# Patient Record
Sex: Female | Born: 1995
Health system: Southern US, Community
[De-identification: ages and names within clinical notes are randomized; demographics above are authoritative.]

---
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
## 2016-01-19 DIAGNOSIS — Z681 Body mass index (BMI) 19 or less, adult: Secondary | ICD-10-CM | POA: Diagnosis not present

## 2016-01-19 DIAGNOSIS — Z113 Encounter for screening for infections with a predominantly sexual mode of transmission: Secondary | ICD-10-CM | POA: Diagnosis not present

## 2016-01-19 DIAGNOSIS — Z01419 Encounter for gynecological examination (general) (routine) without abnormal findings: Secondary | ICD-10-CM | POA: Diagnosis not present

## 2016-01-19 DIAGNOSIS — Z118 Encounter for screening for other infectious and parasitic diseases: Secondary | ICD-10-CM | POA: Diagnosis not present

## 2016-01-19 MED FILL — MEDROXYPROG 150 MG/ML SYR: 150 | 84 days supply | Qty: 1 | Fill #0

## 2016-01-22 DIAGNOSIS — Z3042 Encounter for surveillance of injectable contraceptive: Secondary | ICD-10-CM | POA: Diagnosis not present

## 2016-09-03 ENCOUNTER — Ambulatory Visit: Payer: Self-pay

## 2016-09-04 ENCOUNTER — Ambulatory Visit (INDEPENDENT_AMBULATORY_CARE_PROVIDER_SITE_OTHER): Payer: 59 | Admitting: Physician Assistant

## 2016-09-04 ENCOUNTER — Telehealth: Payer: Self-pay

## 2016-09-04 ENCOUNTER — Ambulatory Visit (INDEPENDENT_AMBULATORY_CARE_PROVIDER_SITE_OTHER): Payer: 59

## 2016-09-04 VITALS — BP 118/76 | HR 96 | Temp 98.3°F | Resp 16 | Ht 67.0 in | Wt 129.0 lb

## 2016-09-04 DIAGNOSIS — R05 Cough: Secondary | ICD-10-CM

## 2016-09-04 DIAGNOSIS — R0981 Nasal congestion: Secondary | ICD-10-CM | POA: Diagnosis not present

## 2016-09-04 DIAGNOSIS — R059 Cough, unspecified: Secondary | ICD-10-CM

## 2016-09-04 DIAGNOSIS — J209 Acute bronchitis, unspecified: Secondary | ICD-10-CM | POA: Diagnosis not present

## 2016-09-04 LAB — POCT CBC
Granulocyte percent: 58.4 % (ref 37–80)
HCT, POC: 38.9 % (ref 37.7–47.9)
Hemoglobin: 13.8 g/dL (ref 12.2–16.2)
Lymph, poc: 2.3 (ref 0.6–3.4)
MCH, POC: 31.2 pg (ref 27–31.2)
MCHC: 35.5 g/dL — AB (ref 31.8–35.4)
MCV: 87.9 fL (ref 80–97)
MID (cbc): 0.5 (ref 0–0.9)
MPV: 8.2 fL (ref 0–99.8)
POC Granulocyte: 3.9 (ref 2–6.9)
POC LYMPH PERCENT: 33.8 % (ref 10–50)
POC MID %: 7.8 %M (ref 0–12)
Platelet Count, POC: 209 10*3/uL (ref 142–424)
RBC: 4.43 M/uL (ref 4.04–5.48)
RDW, POC: 12.9 %
WBC: 6.7 10*3/uL (ref 4.6–10.2)

## 2016-09-04 MED ORDER — MUCINEX DM MAXIMUM STRENGTH 60-1200 MG PO TB12: 1.0000 | ORAL_TABLET | Freq: Two times a day (BID) | ORAL | 1 refills | Status: DC

## 2016-09-04 MED ORDER — HYDROCODONE-HOMATROPINE 5-1.5 MG/5ML PO SYRP: 5.0000 mL | ORAL_SOLUTION | Freq: Three times a day (TID) | ORAL | 0 refills | Status: DC | PRN

## 2016-09-04 MED FILL — HYDROCODONE-HOMATROPINE SYR: 5-1.5 | 8 days supply | Qty: 120 | Fill #0

## 2016-09-04 NOTE — Patient Instructions (Addendum)
I am treating you for a viral illness today. There are several things you can do at home to help you get better.  Put a humidifier in your room at night. Delsym is a good cough syrup during the day - you can get this at your pharmacy.  Please stay well hydrated while you are getting better, especially while taking Mucinex. Try to drink at least 2 Liters of water a day. Use your nasal spray and nasal rinses like a neti-pot.  Drink warm tea with honey, lemon, ginger slices and cloves.   Thank you for coming in today. I hope you feel we met your needs.  Feel free to call [facility] if you have any questions or further requests.  Please consider signing up for MyChart if you do not already have it, as this is a great way to communicate with me.  Best,  Whitney McVey, PA-C  IF you received an x-ray today, you will receive an invoice from [facility]. Please contact [facility] at [phone] with questions or concerns regarding your invoice.   IF you received labwork today, you will receive an invoice from [facility]. Please contact [facility] at [phone] with questions or concerns regarding your invoice.   Our billing staff will not be able to assist you with questions regarding bills from these companies.  You will be contacted with the lab results as soon as they are available. The fastest way to get your results is to activate your My Chart account. Instructions are located on the last page of this paperwork. If you have not heard from [place] regarding the results in 2 weeks, please contact this office.

## 2016-09-04 NOTE — Telephone Encounter (Signed)
Pt mom is calling to talk to someone about pt visit

## 2016-09-04 NOTE — Progress Notes (Signed)
Sonya Hensley  MRN: [id number] DOB: 03/03/1996  PCP: No primary care provider on file.  Subjective:  Pt is a 20 year old female who presents to clinic for cough and chest congestion x 10 days.  She is coughing so hard at night that she is throwing up. This happened one time last night.  Cough keeps her up at night. She has a decreased appetite. Drinking water and tea. She is in town from [place] for the holiday and is staying with her parents. Her room is "really hot" as the heat is on high. She has to sleep with a fan. She does not have a humidifier in her room. +sore throat in the morning. +congestion and runny nose.  Endorses one episode of nose bleed yesterday afternoon.  Denies fever, chills, body aches, sneezing.  She has tried [employer] Pearls and Robitussin - not helping much.   Review of Systems  Constitutional: Negative for chills, diaphoresis, fatigue and fever.  HENT: Positive for nosebleeds. Negative for congestion, postnasal drip, rhinorrhea, sinus pressure, sneezing and sore throat.   Respiratory: Positive for cough. Negative for chest tightness, shortness of breath and wheezing.   Cardiovascular: Negative for chest pain and palpitations.  Gastrointestinal: Positive for vomiting. Negative for abdominal pain, diarrhea and nausea.  Neurological: Negative for weakness, light-headedness and headaches.  Psychiatric/Behavioral: Positive for sleep disturbance.    There are no active problems to display for this patient.   No current outpatient prescriptions on file prior to visit.   No current facility-administered medications on file prior to visit.     Allergies not on file   Objective:  BP 118/76 (BP Location: Right Arm, Patient Position: Sitting, Cuff Size: Normal)   Pulse 96   Temp 98.3 F (36.8 C) (Oral)   Resp 16   Ht 5\' 7"  (1.702 m)   Wt 129 lb (58.5 kg)   LMP 09/03/2016 (Exact Date)   SpO2 98%   BMI 20.20 kg/m   Physical Exam  Constitutional: She  is oriented to person, place, and time and well-developed, well-nourished, and in no distress. No distress.  HENT:  Right Ear: Tympanic membrane normal.  Left Ear: Tympanic membrane normal.  Mouth/Throat: Mucous membranes are normal. Posterior oropharyngeal edema present. No oropharyngeal exudate or posterior oropharyngeal erythema.  Cardiovascular: Normal rate, regular rhythm and normal heart sounds.   Pulmonary/Chest: Effort normal. She has no wheezes. She has no rhonchi. She has no rales.  Neurological: She is alert and oriented to person, place, and time. GCS score is 15.  Skin: Skin is warm and dry.  Psychiatric: Mood, memory, affect and judgment normal.  Vitals reviewed.  Dg Chest 2 View  Result Date: 09/04/2016 CLINICAL DATA:  Cough for 10 days EXAM: CHEST  2 VIEW COMPARISON:  None. FINDINGS: The heart size and mediastinal contours are within normal limits. Both lungs are clear. The visualized skeletal structures are unremarkable. IMPRESSION: No active cardiopulmonary disease. Electronically Signed   By: Elige KoHetal  Patel   On: 09/04/2016 12:59   Results for orders placed or performed in visit on 09/04/16  POCT CBC  Result Value Ref Range   WBC 6.7 4.6 - 10.2 K/uL   Lymph, poc 2.3 0.6 - 3.4   POC LYMPH PERCENT 33.8 10 - 50 %L   MID (cbc) 0.5 0 - 0.9   POC MID % 7.8 0 - 12 %M   POC Granulocyte 3.9 2 - 6.9   Granulocyte percent 58.4 37 - 80 %G  RBC 4.43 4.04 - 5.48 M/uL   Hemoglobin 13.8 12.2 - 16.2 g/dL   HCT, POC 30.838.9 65.737.7 - 47.9 %   MCV 87.9 80 - 97 fL   MCH, POC 31.2 27 - 31.2 pg   MCHC 35.5 (A) 31.8 - 35.4 g/dL   RDW, POC 84.612.9 %   Platelet Count, POC 209 142 - 424 K/uL   MPV 8.2 0 - 99.8 fL   Assessment and Plan :  1. Cough 2. Acute bronchitis, unspecified organism 3. Nasal congestion - POCT CBC - DG Chest 2 View; Future - Dextromethorphan-Guaifenesin (MUCINEX DM MAXIMUM STRENGTH) 60-1200 MG TB12; Take 1 tablet by mouth every 12 (twelve) hours.  Dispense: 14 each;  Refill: 1 - HYDROcodone-homatropine (HYCODAN) 5-1.5 MG/5ML syrup; Take 5 mLs by mouth every 8 (eight) hours as needed for cough.  Dispense: 120 mL; Refill: 0 - Viral etiology suspected. Supportive care encouraged: Push fluids, rest, nasal spray and rinses, humidifier in room at night, RTC in 5-7 days if no improvement.    Marco CollieWhitney Arshia Spellman, PA-C  [facility] 09/04/2016 12:37 PM

## 2016-09-11 NOTE — Telephone Encounter (Signed)
LMOM for pt apologizing that we had not been in touch before now, but asking for CB if she still needs advice or has questions.

## 2016-10-14 DIAGNOSIS — J029 Acute pharyngitis, unspecified: Secondary | ICD-10-CM | POA: Diagnosis not present

## 2017-02-17 DIAGNOSIS — Z3009 Encounter for other general counseling and advice on contraception: Secondary | ICD-10-CM | POA: Diagnosis not present

## 2017-02-17 DIAGNOSIS — Z308 Encounter for other contraceptive management: Secondary | ICD-10-CM | POA: Diagnosis not present

## 2017-02-17 DIAGNOSIS — Z118 Encounter for screening for other infectious and parasitic diseases: Secondary | ICD-10-CM | POA: Diagnosis not present

## 2017-02-17 DIAGNOSIS — Z113 Encounter for screening for infections with a predominantly sexual mode of transmission: Secondary | ICD-10-CM | POA: Diagnosis not present

## 2017-02-17 DIAGNOSIS — Z01419 Encounter for gynecological examination (general) (routine) without abnormal findings: Secondary | ICD-10-CM | POA: Diagnosis not present

## 2017-02-17 DIAGNOSIS — Z68.41 Body mass index (BMI) pediatric, 5th percentile to less than 85th percentile for age: Secondary | ICD-10-CM | POA: Diagnosis not present

## 2017-03-18 DIAGNOSIS — F4323 Adjustment disorder with mixed anxiety and depressed mood: Secondary | ICD-10-CM | POA: Diagnosis not present

## 2017-04-02 DIAGNOSIS — F4323 Adjustment disorder with mixed anxiety and depressed mood: Secondary | ICD-10-CM | POA: Diagnosis not present

## 2017-04-09 DIAGNOSIS — F4323 Adjustment disorder with mixed anxiety and depressed mood: Secondary | ICD-10-CM | POA: Diagnosis not present

## 2017-04-16 DIAGNOSIS — F4323 Adjustment disorder with mixed anxiety and depressed mood: Secondary | ICD-10-CM | POA: Diagnosis not present

## 2017-05-02 DIAGNOSIS — N390 Urinary tract infection, site not specified: Secondary | ICD-10-CM | POA: Diagnosis not present

## 2017-05-02 DIAGNOSIS — Z68.41 Body mass index (BMI) pediatric, 5th percentile to less than 85th percentile for age: Secondary | ICD-10-CM | POA: Diagnosis not present

## 2017-05-02 DIAGNOSIS — N760 Acute vaginitis: Secondary | ICD-10-CM | POA: Diagnosis not present

## 2017-05-24 ENCOUNTER — Encounter (HOSPITAL_COMMUNITY): Payer: Self-pay

## 2017-05-24 ENCOUNTER — Inpatient Hospital Stay (HOSPITAL_COMMUNITY)
Admission: AD | Admit: 2017-05-24 | Discharge: 2017-05-27 | DRG: 885 | Disposition: A | Discharge status: Home / Self Care | Payer: 59 | Attending: Psychiatry | Admitting: Psychiatry

## 2017-05-24 DIAGNOSIS — F4323 Adjustment disorder with mixed anxiety and depressed mood: Secondary | ICD-10-CM | POA: Diagnosis present

## 2017-05-24 DIAGNOSIS — F17210 Nicotine dependence, cigarettes, uncomplicated: Secondary | ICD-10-CM | POA: Diagnosis present

## 2017-05-24 DIAGNOSIS — R45851 Suicidal ideations: Secondary | ICD-10-CM | POA: Diagnosis not present

## 2017-05-24 DIAGNOSIS — F332 Major depressive disorder, recurrent severe without psychotic features: Secondary | ICD-10-CM | POA: Diagnosis not present

## 2017-05-24 DIAGNOSIS — Z915 Personal history of self-harm: Secondary | ICD-10-CM

## 2017-05-24 MED ORDER — ENSURE ENLIVE PO LIQD
237.0000 mL | Freq: Two times a day (BID) | ORAL | Status: DC
  Administered 2017-05-25 – 2017-05-27 (×4): 237 mL via ORAL

## 2017-05-24 MED ORDER — MAGNESIUM HYDROXIDE 400 MG/5ML PO SUSP: 30.0000 mL | Freq: Every day | ORAL | Status: DC | PRN

## 2017-05-24 MED ORDER — ALUM & MAG HYDROXIDE-SIMETH 200-200-20 MG/5ML PO SUSP: 30.0000 mL | ORAL | Status: DC | PRN

## 2017-05-24 MED ORDER — ACETAMINOPHEN 325 MG PO TABS: 650.0000 mg | ORAL_TABLET | Freq: Four times a day (QID) | ORAL | Status: DC | PRN

## 2017-05-24 MED ORDER — HYDROXYZINE HCL 25 MG PO TABS
25.0000 mg | ORAL_TABLET | Freq: Three times a day (TID) | ORAL | Status: DC | PRN
  Administered 2017-05-25 – 2017-05-27 (×3): 25 mg via ORAL
  Filled 2017-05-24 (×3): qty 1

## 2017-05-24 MED ORDER — TRAZODONE HCL 50 MG PO TABS
50.0000 mg | ORAL_TABLET | Freq: Every evening | ORAL | Status: DC | PRN
  Filled 2017-05-24 (×10): qty 1
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
## 2017-05-24 NOTE — H&P (Signed)
Behavioral Health Medical Screening Exam  Sonya Hensley is an 21 y.o. female.  Total Time spent with patient: 15 minutes  Psychiatric Specialty Exam: Physical Exam  Constitutional: She is oriented to person, place, and time. She appears well-developed and well-nourished. No distress.  HENT:  Head: Normocephalic and atraumatic.  Right Ear: External ear normal.  Left Ear: External ear normal.  Eyes: Conjunctivae are normal. Right eye exhibits no discharge. Left eye exhibits no discharge. No scleral icterus.  Cardiovascular: Normal rate, regular rhythm and normal heart sounds.   Respiratory: Effort normal and breath sounds normal. No respiratory distress.  Musculoskeletal: Normal range of motion.  Neurological: She is alert and oriented to person, place, and time.  Skin: Skin is warm and dry. She is not diaphoretic.  Psychiatric: Her mood appears anxious. She is not actively hallucinating. Thought content is not paranoid and not delusional. Cognition and memory are normal. She expresses impulsivity and inappropriate judgment. She exhibits a depressed mood. She expresses suicidal ideation. She expresses no homicidal ideation. She expresses suicidal plans.    Review of Systems  Psychiatric/Behavioral: Positive for depression, substance abuse and suicidal ideas. Negative for hallucinations and memory loss. The patient is nervous/anxious and has insomnia.   All other systems reviewed and are negative.   Blood pressure 110/61, pulse 77, temperature 99.1 F (37.3 C), temperature source Oral, resp. rate 16, SpO2 97 %.There is no height or weight on file to calculate BMI.  General Appearance: Casual and Fairly Groomed  Eye Contact:  Fair  Speech:  Clear and Coherent and Normal Rate  Volume:  Decreased  Mood:  Anxious, Depressed, Hopeless, Irritable and Worthless  Affect:  Congruent and Depressed  Thought Process:  Coherent and Goal Directed  Orientation:  Full (Time, Place, and Person)   Thought Content:  Logical and Hallucinations: None  Suicidal Thoughts:  Yes.  with intent/plan  Homicidal Thoughts:  No  Memory:  Immediate;   Good Recent;   Good  Judgement:  Impaired  Insight:  Lacking  Psychomotor Activity:  Normal  Concentration: Concentration: Poor and Attention Span: Poor  Recall:  Good  Fund of Knowledge:Good  Language: Good  Akathisia:  NA  Handed:  Right  AIMS (if indicated):     Assets:  Communication Skills Desire for Improvement Financial Resources/Insurance Housing Intimacy Leisure Time Physical Health Transportation  Sleep:       Musculoskeletal: Strength & Muscle Tone: within normal limits Gait & Station: normal   Blood pressure 110/61, pulse 77, temperature 99.1 F (37.3 C), temperature source Oral, resp. rate 16, SpO2 97 %.  Recommendations:  Based on my evaluation the patient does not appear to have an emergency medical condition.  Jackelyn Poling, NP 05/24/2017, 9:24 PM
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
## 2017-05-25 ENCOUNTER — Encounter (HOSPITAL_COMMUNITY): Payer: Self-pay | Admitting: Registered Nurse

## 2017-05-25 DIAGNOSIS — F4323 Adjustment disorder with mixed anxiety and depressed mood: Secondary | ICD-10-CM

## 2017-05-25 DIAGNOSIS — R45851 Suicidal ideations: Secondary | ICD-10-CM

## 2017-05-25 DIAGNOSIS — F332 Major depressive disorder, recurrent severe without psychotic features: Principal | ICD-10-CM

## 2017-05-25 LAB — CBC
HEMATOCRIT: 40.8 % (ref 36.0–46.0)
Hemoglobin: 13.3 g/dL (ref 12.0–15.0)
MCH: 29.1 pg (ref 26.0–34.0)
MCHC: 32.6 g/dL (ref 30.0–36.0)
MCV: 89.3 fL (ref 78.0–100.0)
PLATELETS: 278 10*3/uL (ref 150–400)
RBC: 4.57 MIL/uL (ref 3.87–5.11)
RDW: 13.6 % (ref 11.5–15.5)
WBC: 8.5 10*3/uL (ref 4.0–10.5)

## 2017-05-25 LAB — COMPREHENSIVE METABOLIC PANEL
ALBUMIN: 4.4 g/dL (ref 3.5–5.0)
ALT: 50 U/L (ref 14–54)
ANION GAP: 10 (ref 5–15)
AST: 123 U/L — AB (ref 15–41)
Alkaline Phosphatase: 53 U/L (ref 38–126)
BILIRUBIN TOTAL: 0.9 mg/dL (ref 0.3–1.2)
BUN: 12 mg/dL (ref 6–20)
CHLORIDE: 105 mmol/L (ref 101–111)
CO2: 23 mmol/L (ref 22–32)
Calcium: 9.3 mg/dL (ref 8.9–10.3)
Creatinine, Ser: 0.88 mg/dL (ref 0.44–1.00)
GFR calc Af Amer: >60 mL/min (ref >60)
GFR calc non Af Amer: >60 mL/min (ref >60)
GLUCOSE: 77 mg/dL (ref 65–99)
POTASSIUM: 4 mmol/L (ref 3.5–5.1)
SODIUM: 138 mmol/L (ref 135–145)
TOTAL PROTEIN: 7.9 g/dL (ref 6.5–8.1)

## 2017-05-25 LAB — TSH: TSH: 1.361 u[IU]/mL (ref 0.350–4.500)

## 2017-05-25 MED ORDER — FLUOXETINE HCL 10 MG PO CAPS
10.0000 mg | ORAL_CAPSULE | Freq: Every day | ORAL | Status: DC
  Administered 2017-05-25 – 2017-05-27 (×3): 10 mg via ORAL
  Filled 2017-05-25 (×6): qty 1

## 2017-05-25 NOTE — [facility] Group Notes (Signed)
[facility] LCSW Group Therapy Note  05/25/2017  11:15 to 12 PM   Type of Therapy and Topic: Group Therapy: Feelings Around Returning Home & Establishing a Supportive Framework   Participation Level: Active    Description of Group:  Patients first processed thoughts and feelings about up coming discharge. These included fears of upcoming changes, lack of change, new living environments, judgements and expectations from others and overall stigma of MH issues. We then discussed what is a supportive framework? What does it look like feel like and how do I discern it from and unhealthy non-supportive network? Learn how to cope when supports are not helpful and don't support you. Discuss what to do when your family/friends are not supportive.   Therapeutic Goals Addressed in Processing Group:  1. Patient will identify one healthy supportive network that they can use at discharge. 2. Patient will identify one factor of a supportive framework and how to tell it from an unhealthy network. 3. Patient able to identify one coping skill to use when they do not have positive supports from others. 4. Patient will demonstrate ability to communicate their needs through discussion and/or role plays.  Summary of Patient Progress:  Pt engaged easily during this her first group session. As patients processed their anxiety about discharge and described healthy supports patient shared frustrations with some of her supports and processed what would be important to new supports.    Carney Bern, LCSW

## 2017-05-25 NOTE — Progress Notes (Signed)
Patient ID: Sonya Hensley, female   DOB: September 24, 1995, 21 y.o.   MRN: [id number]  Pt currently presents with a flat affect and guarded behavior. Pt reports to writer that their goal is to "not think about things." Reports that she was able to "keep her mind on other things." Pt states "I want to do a family session with my mother, father and aunt. Then I good be an early discharge."  Pt reports good sleep without any medication. Pt minimizes current issues and concerns.    Pt's labs and vitals were monitored throughout the night. Pt given a 1:1 about emotional and mental status. Pt supported and encouraged to express concerns and questions this Clinical research associate, pt forwards little. Pt encouraged to speak with clinical team about concerns and discharge plans.   Pt's safety ensured with 15 minute and environmental checks. Pt currently denies SI/HI and A/V hallucinations. Pt verbally agrees to seek staff if SI/HI or A/VH occurs and to consult with staff before acting on any harmful thoughts. Reports that she will be following up with her OP therapist post discharge. Will continue POC.
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
## 2017-05-25 NOTE — Plan of Care (Signed)
Problem: Education: Goal: Utilization of techniques to improve thought processes will improve Outcome: Progressing Nurse discussed depression/anxiety/coping skills with patient.    
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
## 2017-05-26 DIAGNOSIS — F4323 Adjustment disorder with mixed anxiety and depressed mood: Secondary | ICD-10-CM

## 2017-05-26 LAB — LIPID PANEL
Cholesterol: 180 mg/dL (ref 0–200)
HDL: 40 mg/dL — ABNORMAL LOW (ref >40)
LDL CALC: 126 mg/dL — AB (ref 0–99)
Total CHOL/HDL Ratio: 4.5 RATIO
Triglycerides: 70 mg/dL (ref <150)
VLDL: 14 mg/dL (ref 0–40)

## 2017-05-26 LAB — RAPID URINE DRUG SCREEN, HOSP PERFORMED
AMPHETAMINES: NOT DETECTED
Barbiturates: NOT DETECTED
Benzodiazepines: NOT DETECTED
COCAINE: NOT DETECTED
OPIATES: NOT DETECTED
TETRAHYDROCANNABINOL: POSITIVE — AB

## 2017-05-26 LAB — HEMOGLOBIN A1C
Hgb A1c MFr Bld: 5.3 % (ref 4.8–5.6)
Mean Plasma Glucose: 105.41 mg/dL

## 2017-05-26 LAB — PREGNANCY, URINE: Preg Test, Ur: NEGATIVE
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
## 2017-05-26 NOTE — Progress Notes (Signed)
  DATA ACTION RESPONSE  Objective- Pt. is visible in the dayroom, seen interacting in the dayroom with peers. Presents with a   depressed/anxious affect and mood. Pt appears to be vested in treatment. Pt express concern of finding a place to stay after d/c. No abnormal s/s.  Subjective- Denies having any SI/HI/AVH/Pain at this time. Is cooperative and remain safe on the unit.  1:1 interaction in private to establish rapport. Encouragement, education, & support given from staff.    Safety maintained with Q 15 checks. Continue with POC.

## 2017-05-26 NOTE — BHH Group Notes (Signed)
LCSW Group Therapy Note   05/26/2017 1:15pm   Type of Therapy and Topic:  Group Therapy:  Overcoming Obstacles   Participation Level:  Active   Description of Group:    In this group patients will be encouraged to explore what they see as obstacles to their own wellness and recovery. They will be guided to discuss their thoughts, feelings, and behaviors related to these obstacles. The group will process together ways to cope with barriers, with attention given to specific choices patients can make. Each patient will be challenged to identify changes they are motivated to make in order to overcome their obstacles. This group will be process-oriented, with patients participating in exploration of their own experiences as well as giving and receiving support and challenge from other group members.   Therapeutic Goals: 1. Patient will identify personal and current obstacles as they relate to admission. 2. Patient will identify barriers that currently interfere with their wellness or overcoming obstacles.  3. Patient will identify feelings, thought process and behaviors related to these barriers. 4. Patient will identify two changes they are willing to make to overcome these obstacles:       Therapeutic Modalities:   Cognitive Behavioral Therapy Solution Focused Therapy Motivational Interviewing Relapse Prevention Therapy  Polina Burmaster C Gideon Burstein, LCSW 05/26/2017 2:45 PM 

## 2017-05-26 NOTE — Progress Notes (Signed)
Recreation Therapy Notes  Date: 05/26/17 Time: 0930 Location: 300 Dayroom  Group Topic: Stress Management  Goal Area(s) Addresses:  Patient will verbalize importance of using healthy stress management.  Patient will identify positive emotions associated with healthy stress management.   Intervention: Stress Management  Activity :  Meditation.  LRT introduced the stress management technique of meditation.  Patients were to listen and follow along as a meditation played from the Calm app.    Education:  Stress Management, Discharge Planning.   Education Outcome: Acknowledges edcuation/In group clarification offered/Needs additional education  Clinical Observations/Feedback: Pt did not attend group.   Caroll Rancher, LRT/CTRS         Caroll Rancher A 05/26/2017 12:01 PM

## 2017-05-26 NOTE — Plan of Care (Signed)
Problem: Coping: Goal: Ability to cope will improve Outcome: Progressing  Pt identified coloring and engaging with peers as coping mechanisms for increased anxiety.
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
## 2017-05-26 NOTE — Tx Team (Signed)
Interdisciplinary Treatment and Diagnostic Plan Update  05/26/2017 Time of Session: 9:30am Sonya Hensley MRN: [id number]  Principal Diagnosis: Severe recurrent major depression without psychotic features [facility])  Secondary Diagnoses: Principal Problem:   Severe recurrent major depression without psychotic features ([place]) Active Problems:   Adjustment disorder with mixed anxiety and depressed mood   Current Medications:  Current Facility-Administered Medications  Medication Dose Route Frequency Provider Last Rate Last Dose  . acetaminophen (TYLENOL) tablet 650 mg  650 mg Oral Q6H PRN Lindon Romp A, NP      . alum & mag hydroxide-simeth (MAALOX/MYLANTA) 200-200-20 MG/5ML suspension 30 mL  30 mL Oral Q4H PRN Lindon Romp A, NP      . feeding supplement (ENSURE ENLIVE) (ENSURE ENLIVE) liquid 237 mL  237 mL Oral BID BM Cobos, Myer Peer, MD   237 mL at 05/26/17 0825  . FLUoxetine (PROZAC) capsule 10 mg  10 mg Oral Daily Rankin, Shuvon B, NP   10 mg at 05/26/17 0820  . hydrOXYzine (ATARAX/VISTARIL) tablet 25 mg  25 mg Oral TID PRN Lindon Romp A, NP   25 mg at 05/25/17 1313  . magnesium hydroxide (MILK OF MAGNESIA) suspension 30 mL  30 mL Oral Daily PRN Lindon Romp A, NP      . traZODone (DESYREL) tablet 50 mg  50 mg Oral QHS,MR X 1 Lindon Romp A, NP        PTA Medications: No prescriptions prior to admission.    Treatment Modalities: Medication Management, Group therapy, Case management,  1 to 1 session with clinician, Psychoeducation, Recreational therapy.  Patient Stressors: Educational concerns Traumatic event  Patient Strengths: Average or above average intelligence Communication skills General fund of knowledge Supportive family/friends  Physician Treatment Plan for Primary Diagnosis: Severe recurrent major depression without psychotic features ([name]) Long Term Goal(s): Improvement in symptoms so as ready for discharge  Short Term Goals: Ability to identify changes in  lifestyle to reduce recurrence of condition will improve Ability to disclose and discuss suicidal ideas Ability to identify and develop effective coping behaviors will improve Ability to identify triggers associated with substance abuse/mental health issues will improve Ability to identify changes in lifestyle to reduce recurrence of condition will improve Ability to verbalize feelings will improve Ability to disclose and discuss suicidal ideas Ability to demonstrate self-control will improve Ability to maintain clinical measurements within normal limits will improve Compliance with prescribed medications will improve  Medication Management: Evaluate patient's response, side effects, and tolerance of medication regimen.  Therapeutic Interventions: 1 to 1 sessions, Unit Group sessions and Medication administration.  Evaluation of Outcomes: Not Met  Physician Treatment Plan for Secondary Diagnosis: Principal Problem:   Severe recurrent major depression without psychotic features ([name]) Active Problems:   Adjustment disorder with mixed anxiety and depressed mood   Long Term Goal(s): Improvement in symptoms so as ready for discharge  Short Term Goals: Ability to identify changes in lifestyle to reduce recurrence of condition will improve Ability to disclose and discuss suicidal ideas Ability to identify and develop effective coping behaviors will improve Ability to identify triggers associated with substance abuse/mental health issues will improve Ability to identify changes in lifestyle to reduce recurrence of condition will improve Ability to verbalize feelings will improve Ability to disclose and discuss suicidal ideas Ability to demonstrate self-control will improve Ability to maintain clinical measurements within normal limits will improve Compliance with prescribed medications will improve  Medication Management: Evaluate patient's response, side effects, and tolerance of  medication regimen.  Therapeutic Interventions: 1 to 1 sessions, Unit Group sessions and Medication administration.  Evaluation of Outcomes: Not Met   RN Treatment Plan for Primary Diagnosis: Severe recurrent major depression without psychotic features ([name]) Long Term Goal(s): Knowledge of disease and therapeutic regimen to maintain health will improve  Short Term Goals: Ability to disclose and discuss suicidal ideas, Ability to identify and develop effective coping behaviors will improve and Compliance with prescribed medications will improve  Medication Management: RN will administer medications as ordered by provider, will assess and evaluate patient's response and provide education to patient for prescribed medication. RN will report any adverse and/or side effects to prescribing provider.  Therapeutic Interventions: 1 on 1 counseling sessions, Psychoeducation, Medication administration, Evaluate responses to treatment, Monitor vital signs and CBGs as ordered, Perform/monitor CIWA, COWS, AIMS and Fall Risk screenings as ordered, Perform wound care treatments as ordered.  Evaluation of Outcomes: Not Met   LCSW Treatment Plan for Primary Diagnosis: Severe recurrent major depression without psychotic features ([name]) Long Term Goal(s): Safe transition to appropriate next level of care at discharge, Engage patient in therapeutic group addressing interpersonal concerns.  Short Term Goals: Engage patient in aftercare planning with referrals and resources, Identify triggers associated with mental health/substance abuse issues and Increase skills for wellness and recovery  Therapeutic Interventions: Assess for all discharge needs, 1 to 1 time with Social worker, Explore available resources and support systems, Assess for adequacy in community support network, Educate family and significant other(s) on suicide prevention, Complete Psychosocial Assessment, Interpersonal group therapy.  Evaluation  of Outcomes: Not Met   Progress in Treatment: Attending groups: Yes  Participating in groups: Yes Taking medication as prescribed: Yes, MD continues to assess for medication changes as needed Toleration medication: Yes, no side effects reported at this time Family/Significant other contact made: No, CSW assessing for appropriate contact Patient understands diagnosis: Continuing to assess Discussing patient identified problems/goals with staff: Yes Medical problems stabilized or resolved: Yes Denies suicidal/homicidal ideation: Yes Issues/concerns per patient self-inventory: None Other: N/A  New problem(s) identified: None identified at this time.   New Short Term/Long Term Goal(s): None identified at this time.   Discharge Plan or Barriers: Pt will return home and follow-up with outpatient services. Pt current with services at [facility] [place]  Reason for Continuation of Hospitalization: Anxiety Depression Medication stabilization  Estimated Length of Stay: 3-5 days; est DC date 9/21  Attendees: Patient:  05/26/2017  12:12 PM  Physician: Dr. Parke Poisson, MD 05/26/2017  12:12 PM  Nursing: Sharl Ma RN 05/26/2017  12:12 PM  RN Care Manager: Lars Pinks, RN 05/26/2017  12:12 PM  Social Worker: Adriana Reams, LCSW 05/26/2017  12:12 PM  Recreational Therapist:  05/26/2017  12:12 PM  Other: Lindell Spar, NP; Marvia Pickles, NP 05/26/2017  12:12 PM  Other:  05/26/2017  12:12 PM  Other: 05/26/2017  12:12 PM    Scribe for Treatment Team: Gladstone Lighter, LCSW 05/26/2017 12:12 PM

## 2017-05-26 NOTE — Progress Notes (Signed)
D: Pt presents with an anxious mood. Pt reported that she's always anxious. Pt observed using coping skills today by coloring and engaging with others.Pt denies any depression. Pt reports fair sleep last night. Pt denies SI and verbally contracts for safety. Pt reports that she's tolerating Prozac well. No side effects to meds verbalized by pt.  A: Medications reviewed with pt. Medications administered as ordered per MD. Verbal support provided. Pt encouraged to attend groups. 15 minute checks performed for safety.  R: Pt compliant with tx.

## 2017-05-26 NOTE — Plan of Care (Signed)
Problem: Safety: Goal: Periods of time without injury will increase Outcome: Progressing Pt remains a low fall risk, denies SI/HI at this time.   
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
## 2017-05-27 LAB — HEPATIC FUNCTION PANEL
ALK PHOS: 58 U/L (ref 38–126)
ALT: 53 U/L (ref 14–54)
AST: 91 U/L — AB (ref 15–41)
Albumin: 4.4 g/dL (ref 3.5–5.0)
Bilirubin, Direct: <0.1 mg/dL — ABNORMAL LOW (ref 0.1–0.5)
TOTAL PROTEIN: 8.1 g/dL (ref 6.5–8.1)
Total Bilirubin: 0.8 mg/dL (ref 0.3–1.2)

## 2017-05-27 MED ORDER — TRAZODONE HCL 50 MG PO TABS: 50.0000 mg | ORAL_TABLET | Freq: Every evening | ORAL | 0 refills | Status: AC | PRN

## 2017-05-27 MED ORDER — FLUOXETINE HCL 10 MG PO CAPS: 10.0000 mg | ORAL_CAPSULE | Freq: Every day | ORAL | 0 refills | Status: AC

## 2017-05-27 MED ORDER — HYDROXYZINE HCL 25 MG PO TABS: 25.0000 mg | ORAL_TABLET | Freq: Three times a day (TID) | ORAL | 0 refills | Status: AC | PRN

## 2017-05-27 MED FILL — FLUoxetine HCL 10 MG CAPS: 10 | 30 days supply | Qty: 30 | Fill #0

## 2017-05-27 MED FILL — hydrOXYzine HCL 25 MG TABS: 25 | 10 days supply | Qty: 30 | Fill #0

## 2017-05-27 MED FILL — traZODone HCL 50 MG TABS: 50 | 15 days supply | Qty: 30 | Fill #0
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
## 2017-05-27 NOTE — [facility] Suicide Risk Assessment (Signed)
[facility] INPATIENT:  Family/Significant Other Suicide Prevention Education  Suicide Prevention Education:  Education Completed; Sharetha Newson, Pt's mother [phone], has been identified by the patient as the family member/significant other with whom the patient will be residing, and identified as the person(s) who will aid the patient in the event of a mental health crisis (suicidal ideations/suicide attempt).  With written consent from the patient, the family member/significant other has been provided the following suicide prevention education, prior to the and/or following the discharge of the patient.  The suicide prevention education provided includes the following:  Suicide risk factors  Suicide prevention and interventions  National Suicide Hotline telephone number  [facility] assessment telephone number  [facility] Emergency Assistance 911  [facility] and/or Residential Mobile Crisis Unit telephone number  Request made of family/significant other to:  Remove weapons (e.g., guns, rifles, knives), all items previously/currently identified as safety concern.    Remove drugs/medications (over-the-counter, prescriptions, illicit drugs), all items previously/currently identified as a safety concern.  The family member/significant other verbalizes understanding of the suicide prevention education information provided.  The family member/significant other agrees to remove the items of safety concern listed above.  Pt's mother expresses concern for Pt's mood lability and reckless behaviors. Pt's mother reports that she does not want Pt to return to [place] and that Pt will not be getting her car back at this time. Pt's mother agrees to be supportive at discharge.   Verdene Lennert 05/27/2017, 5:40 PM
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
## 2017-05-27 NOTE — Progress Notes (Signed)
D:  Patient's self inventory sheet, patient has fair sleep, no sleep medication given.  Poor appetite, normal energy level, good concentration.  Rated depression 5, hopeless 4, anxiety 6.  Denied withdrawals.  Checked agitation and irritability.  Denied SI.  Denied physical problems.  Denied physical pain.  Goal is make aftercare discharge plan.  Plans to have family meeting.  Does have discharge plans. A:  Medications administered per MD orders.  Emotional support and encouragement given patient. R:  Denied SI and HI, contracts for safety.  Denied A/V hallucinations.  Safety maintained with 15 minute checks.
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
## 2017-05-27 NOTE — Progress Notes (Signed)
Adult Psychoeducational Group Note  Date:  05/27/2017 Time:  1:27 AM  Group Topic/Focus:  Wrap-Up Group:   The focus of this group is to help patients review their daily goal of treatment and discuss progress on daily workbooks.  Participation Level:  Active  Participation Quality:  Attentive  Affect:  Appropriate  Cognitive:  Appropriate  Insight: Appropriate  Engagement in Group:  Engaged  Modes of Intervention:  Discussion  Additional Comments:  Pt stated her goal for today was to talk with her doctor about her discharge plan and after care plan. Pt stated she was able to accomplished this goal but plan to speak more with her doctor tomorrow. Pt rated her over all day a 8 out of 10.  Felipa Furnace 05/27/2017, 1:27 AM
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
## 2017-10-04 IMAGING — DX DG CHEST 2V
2 series · 2 of 2 positions shown · non-contrast
Comparison: None.

CLINICAL DATA: Cough for 10 days

EXAM:
CHEST  2 VIEW

[chest pa]
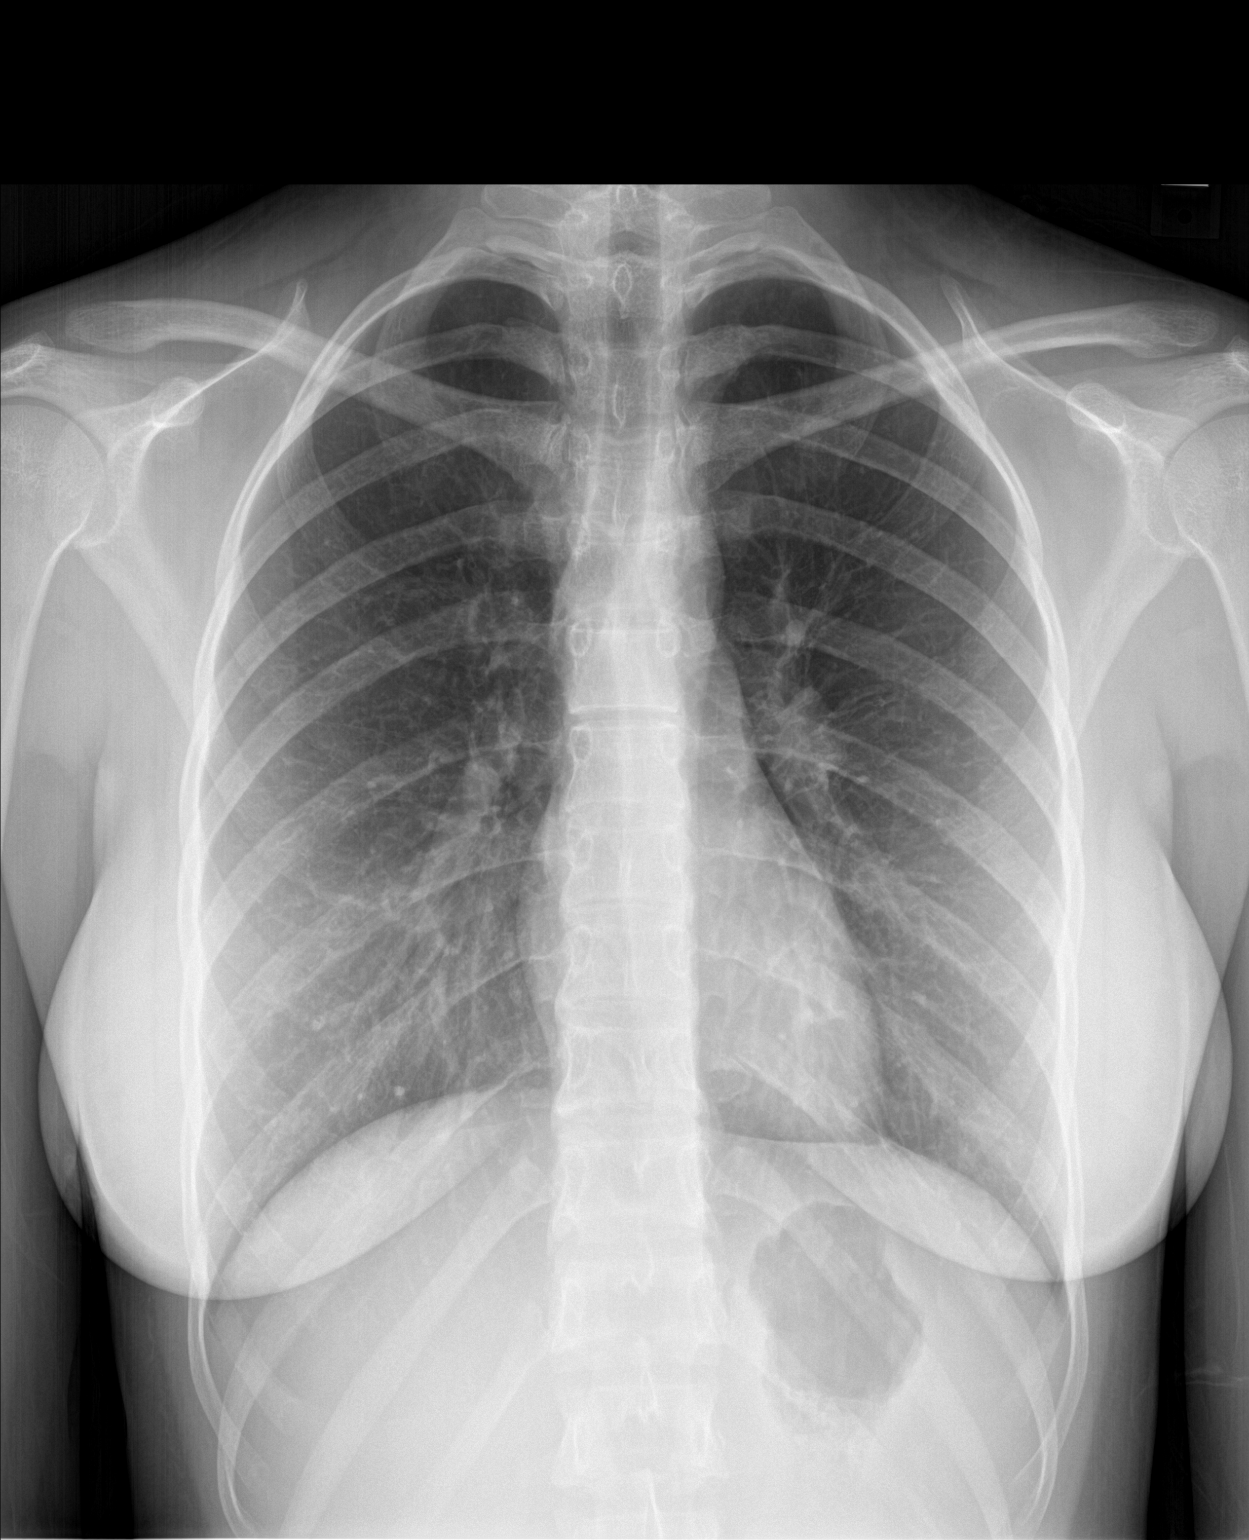

[chest lat]
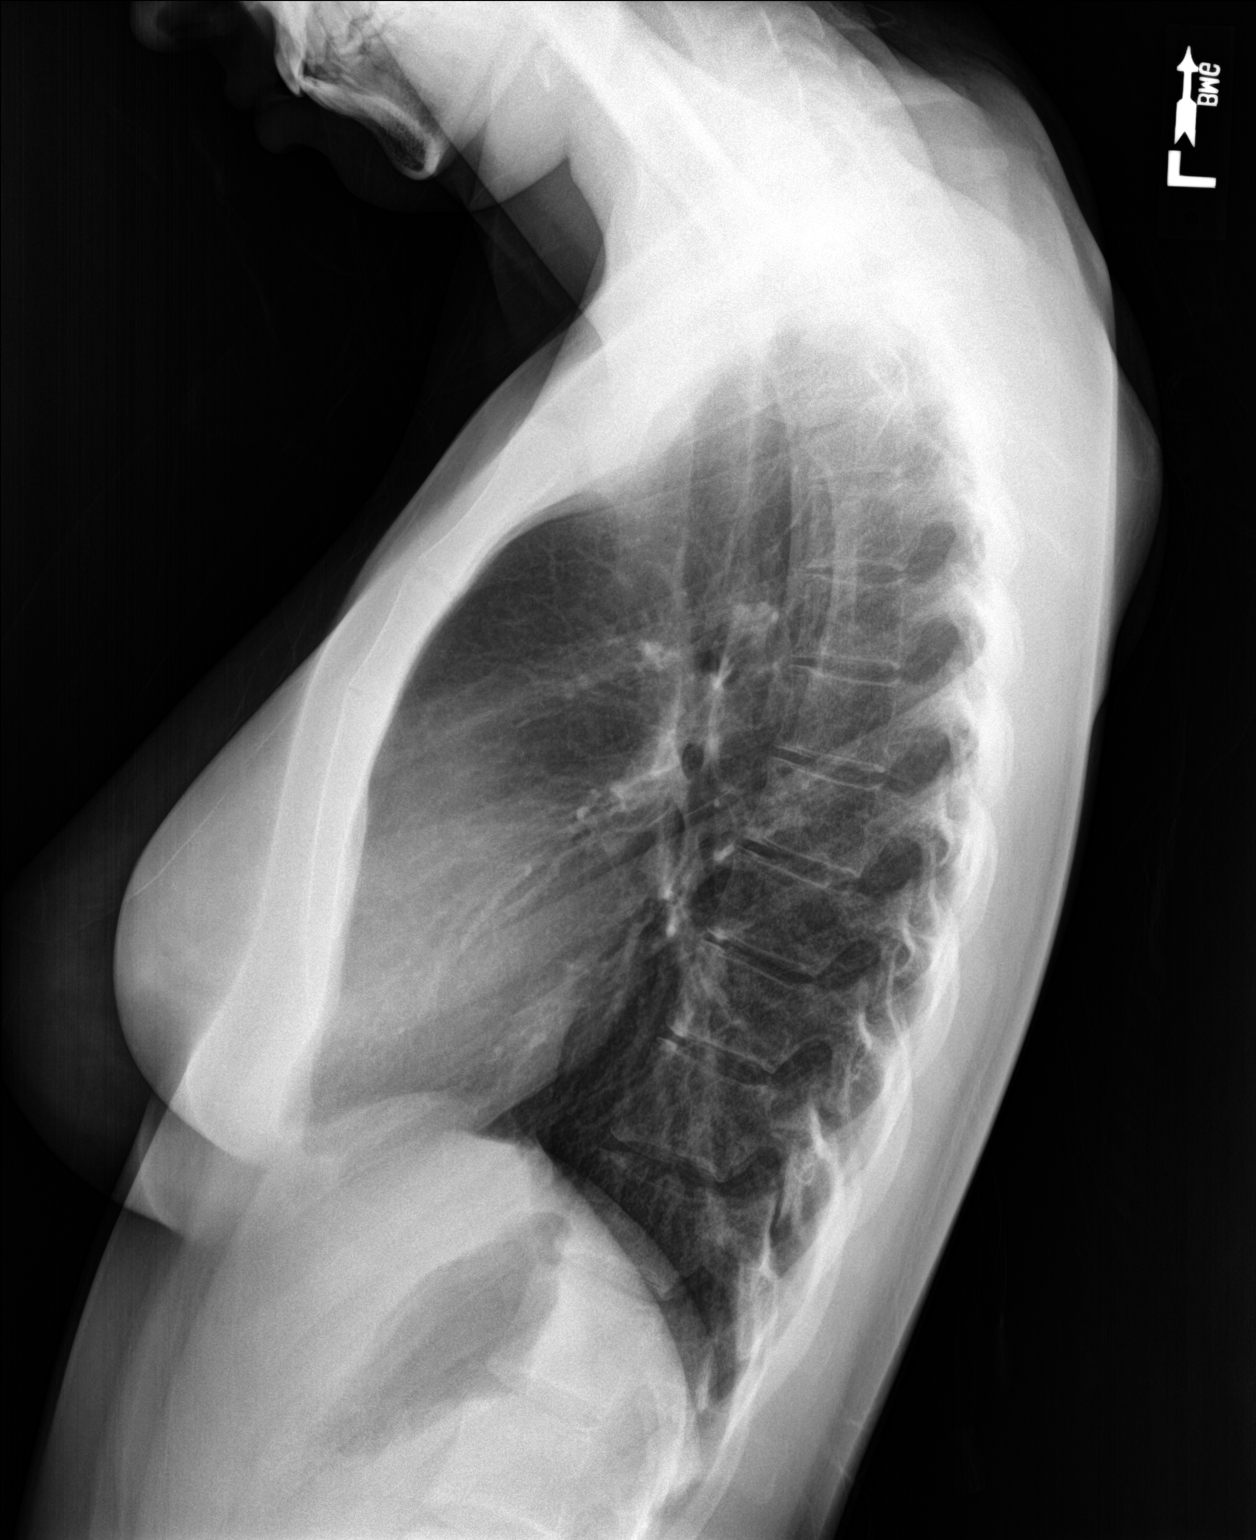

[2 of 2 positions shown; findings below may reference images not displayed]

FINDINGS: The heart size and mediastinal contours are within normal limits.
Both lungs are clear. The visualized skeletal structures are
unremarkable.
IMPRESSION: No active cardiopulmonary disease.

## 2017-11-12 DIAGNOSIS — Z68.41 Body mass index (BMI) pediatric, less than 5th percentile for age: Secondary | ICD-10-CM | POA: Diagnosis not present

## 2017-11-12 DIAGNOSIS — Z308 Encounter for other contraceptive management: Secondary | ICD-10-CM | POA: Diagnosis not present

## 2019-12-02 ENCOUNTER — Ambulatory Visit: Payer: Self-pay | Attending: Internal Medicine

## 2019-12-02 DIAGNOSIS — Z23 Encounter for immunization: Secondary | ICD-10-CM

## 2019-12-02 NOTE — Progress Notes (Signed)
   Covid-19 Vaccination Clinic  Name:  Sonya Hensley    MRN: [id number] DOB: Aug 30, 1996  12/02/2019  Sonya Hensley was observed post Covid-19 immunization for 15 minutes without incident. She was provided with Vaccine Information Sheet and instruction to access the V-Safe system.   Sonya Hensley was instructed to call 911 with any severe reactions post vaccine: Marland Kitchen Difficulty breathing  . Swelling of face and throat  . A fast heartbeat  . A bad rash all over body  . Dizziness and weakness   Immunizations Administered    Name Date Dose VIS Date Route   Pfizer COVID-19 Vaccine 12/02/2019  4:53 PM 0.3 mL 08/20/2019 Intramuscular   Manufacturer: [employer], [employer]   Lot: [id number]   NDC: [address]-3

## 2019-12-27 ENCOUNTER — Ambulatory Visit: Payer: Self-pay | Attending: Internal Medicine

## 2019-12-27 DIAGNOSIS — Z23 Encounter for immunization: Secondary | ICD-10-CM

## 2019-12-27 NOTE — Progress Notes (Signed)
   Covid-19 Vaccination Clinic  Name:  TYNA HUERTAS    MRN: [id number] DOB: 07-31-1996  12/27/2019  Ms. Sculley was observed post Covid-19 immunization for 15 minutes without incident. She was provided with Vaccine Information Sheet and instruction to access the V-Safe system.   Ms. Commisso was instructed to call 911 with any severe reactions post vaccine: Marland Kitchen Difficulty breathing  . Swelling of face and throat  . A fast heartbeat  . A bad rash all over body  . Dizziness and weakness   Immunizations Administered    Name Date Dose VIS Date Route   Pfizer COVID-19 Vaccine 12/27/2019  1:51 PM 0.3 mL 11/03/2018 Intramuscular   Manufacturer: [employer], [employer]   Lot: [id number]   NDC: [address]-3
